# Patient Record
Sex: Male | Born: 1935 | Race: White | Hispanic: No | Marital: Married | Smoking: Never smoker
Health system: Southern US, Community
[De-identification: ages and names within clinical notes are randomized; demographics above are authoritative.]

## PROBLEM LIST (undated history)

## (undated) DIAGNOSIS — I10 Essential (primary) hypertension: Secondary | ICD-10-CM

## (undated) DIAGNOSIS — I4891 Unspecified atrial fibrillation: Secondary | ICD-10-CM

## (undated) DIAGNOSIS — R569 Unspecified convulsions: Secondary | ICD-10-CM

---

## 2021-08-21 ENCOUNTER — Encounter (HOSPITAL_COMMUNITY): Payer: Self-pay

## 2021-08-21 ENCOUNTER — Emergency Department (HOSPITAL_COMMUNITY): Payer: Medicare Other

## 2021-08-21 ENCOUNTER — Emergency Department (HOSPITAL_COMMUNITY)
Admission: EM | Admit: 2021-08-21 | Discharge: 2021-08-21 | Disposition: A | Discharge status: Home / Self Care | Payer: Medicare Other | Attending: Emergency Medicine | Admitting: Emergency Medicine

## 2021-08-21 ENCOUNTER — Other Ambulatory Visit: Payer: Self-pay

## 2021-08-21 DIAGNOSIS — R0781 Pleurodynia: Secondary | ICD-10-CM | POA: Diagnosis present

## 2021-08-21 DIAGNOSIS — Z7901 Long term (current) use of anticoagulants: Secondary | ICD-10-CM | POA: Diagnosis not present

## 2021-08-21 DIAGNOSIS — W19XXXA Unspecified fall, initial encounter: Secondary | ICD-10-CM | POA: Diagnosis not present

## 2021-08-21 DIAGNOSIS — R42 Dizziness and giddiness: Secondary | ICD-10-CM | POA: Insufficient documentation

## 2021-08-21 HISTORY — DX: Unspecified convulsions: R56.9

## 2021-08-21 HISTORY — DX: Essential (primary) hypertension: I10

## 2021-08-21 HISTORY — DX: Unspecified atrial fibrillation: I48.91

## 2021-08-21 LAB — CBC WITH DIFFERENTIAL/PLATELET
Abs Immature Granulocytes: 0.04 10*3/uL (ref 0.00–0.07)
Basophils Absolute: 0.1 10*3/uL (ref 0.0–0.1)
Basophils Relative: 1 %
Eosinophils Absolute: 0.2 10*3/uL (ref 0.0–0.5)
Eosinophils Relative: 2 %
HCT: 41.8 % (ref 39.0–52.0)
Hemoglobin: 13.7 g/dL (ref 13.0–17.0)
Immature Granulocytes: 0 %
Lymphocytes Relative: 8 %
Lymphs Abs: 0.9 10*3/uL (ref 0.7–4.0)
MCH: 30.6 pg (ref 26.0–34.0)
MCHC: 32.8 g/dL (ref 30.0–36.0)
MCV: 93.3 fL (ref 80.0–100.0)
Monocytes Absolute: 0.9 10*3/uL (ref 0.1–1.0)
Monocytes Relative: 9 %
Neutro Abs: 8.1 10*3/uL — ABNORMAL HIGH (ref 1.7–7.7)
Neutrophils Relative %: 80 %
Platelets: 210 10*3/uL (ref 150–400)
RBC: 4.48 MIL/uL (ref 4.22–5.81)
RDW: 12.7 % (ref 11.5–15.5)
WBC: 10.2 10*3/uL (ref 4.0–10.5)
nRBC: 0 % (ref 0.0–0.2)

## 2021-08-21 LAB — COMPREHENSIVE METABOLIC PANEL
ALT: 23 U/L (ref 0–44)
AST: 23 U/L (ref 15–41)
Albumin: 4.6 g/dL (ref 3.5–5.0)
Alkaline Phosphatase: 89 U/L (ref 38–126)
Anion gap: 7 (ref 5–15)
BUN: 14 mg/dL (ref 8–23)
CO2: 28 mmol/L (ref 22–32)
Calcium: 9.5 mg/dL (ref 8.9–10.3)
Chloride: 107 mmol/L (ref 98–111)
Creatinine, Ser: 0.93 mg/dL (ref 0.61–1.24)
GFR, Estimated: >60 mL/min (ref >60)
Glucose, Bld: 115 mg/dL — ABNORMAL HIGH (ref 70–99)
Potassium: 4.7 mmol/L (ref 3.5–5.1)
Sodium: 142 mmol/L (ref 135–145)
Total Bilirubin: 0.5 mg/dL (ref 0.3–1.2)
Total Protein: 7.7 g/dL (ref 6.5–8.1)

## 2021-08-21 LAB — PHENYTOIN LEVEL, TOTAL: Phenytoin Lvl: 8.7 ug/mL — ABNORMAL LOW (ref 10.0–20.0)

## 2021-08-21 MED ORDER — IBUPROFEN 200 MG PO TABS
400.0000 mg | ORAL_TABLET | Freq: Once | ORAL | Status: AC
  Administered 2021-08-21: 400 mg via ORAL
  Filled 2021-08-21: qty 2

## 2021-08-21 NOTE — ED Triage Notes (Addendum)
Patient states his right leg gave out last night and caused him to fall on carpet. Patient does not know if he hit his head. Patient is on Eliquis. Patient c/o right rib cage pain.  BP- 147/91.

## 2021-08-21 NOTE — ED Provider Notes (Signed)
[facility]EM[facility]GENCY DEPT Provider Note   CSN: [id number] Arrival date & time: 08/21/21  1013     History  Chief Complaint  Patient presents with   Charlotta Newton Donyea Beverlin is a 88 y.o. male.  88 year old male who presents after having an a fall yesterday.  Patient states that has not been sleeping very well.  Got up felt dizzy and fell down onto carpet.  Is unsure if he struck his head or passed out.  Complains of pain to his right rib cage.  Denies any confusion or emesis.  Patient is on Eliquis.  He has not been short of breath.  Has had trouble ambulating       Home Medications Prior to Admission medications   Not on File      Allergies    Patient has no known allergies.    Review of Systems   Review of Systems  All other systems reviewed and are negative.   Physical Exam Updated Vital Signs BP (!) 147/91 (BP Location: Right Arm)   Pulse 86   Temp 98.3 F (36.8 C) (Oral)   Resp 16   Ht 1.829 m (6')   Wt 93.4 kg   SpO2 97%   BMI 27.94 kg/m  Physical Exam Vitals and nursing note reviewed.  Constitutional:      General: He is not in acute distress.    Appearance: Normal appearance. He is well-developed. He is not toxic-appearing.  HENT:     Head: Normocephalic and atraumatic.  Eyes:     General: Lids are normal.     Conjunctiva/sclera: Conjunctivae normal.     Pupils: Pupils are equal, round, and reactive to light.  Neck:     Thyroid: No thyroid mass.     Trachea: No tracheal deviation.  Cardiovascular:     Rate and Rhythm: Normal rate and regular rhythm.     Heart sounds: Normal heart sounds. No murmur heard.    No gallop.  Pulmonary:     Effort: Pulmonary effort is normal. No respiratory distress.     Breath sounds: Normal breath sounds. No stridor. No decreased breath sounds, wheezing, rhonchi or rales.  Abdominal:     General: There is no distension.     Palpations: Abdomen is soft.     Tenderness: There is no  abdominal tenderness. There is no rebound.  Musculoskeletal:        General: No tenderness. Normal range of motion.     Cervical back: Normal range of motion and neck supple.  Skin:    General: Skin is warm and dry.     Findings: No abrasion or rash.  Neurological:     General: No focal deficit present.     Mental Status: He is alert and oriented to person, place, and time. Mental status is at baseline.     GCS: GCS eye subscore is 4. GCS verbal subscore is 5. GCS motor subscore is 6.     Cranial Nerves: No cranial nerve deficit.     Sensory: No sensory deficit.     Motor: Motor function is intact.     Gait: Gait is intact.  Psychiatric:        Attention and Perception: Attention normal.        Speech: Speech normal.        Behavior: Behavior normal.     ED Results / Procedures / Treatments   Labs (all labs ordered are listed, but only  abnormal results are displayed) Labs Reviewed  CBC WITH DIFFERENTIAL/PLATELET  COMPREHENSIVE METABOLIC PANEL  PHENYTOIN LEVEL, TOTAL    EKG EKG Interpretation  Date/Time:  Friday August 21 2021 13:27:12 EDT Ventricular Rate:  79 PR Interval:  52 QRS Duration: 87 QT Interval:  382 QTC Calculation: 438 R Axis:   -20 Text Interpretation: Sinus or ectopic atrial rhythm Atrial premature complex Short PR interval Borderline left axis deviation Confirmed by Lorre Nick ([id number]) on 08/21/2021 1:29:10 PM  Radiology No results found.  Procedures Procedures    Medications Ordered in ED Medications - No data to display  ED Course/ Medical Decision Making/ A&P                           Medical Decision Making Amount and/or Complexity of Data Reviewed Labs: ordered. Radiology: ordered.  Risk OTC drugs.   Patient's EKG per my interpretation shows sinus rhythm without evidence of acute ischemic changes.  Patient's neurological exam is stable here.  Head CT per my interpretation shows no acute findings.  Patient had an x-ray of his right  ribs and chest which per interpretation showed no acute abnormality.  Concern for possible Dilantin toxicity as patient stated that he felt slightly ataxic.  His Dilantin level was subtherapeutic at 8.7.  Patient states this is where he normally is.  Lecture lites within normal limits.  Patient endorses that he has not taken his anxiety medications.  I do not feel that patient has any acute neurological issue.  His family who is at bedside concurs with this.  Will discharge home and follow-up with his doctor as needed        Final Clinical Impression(s) / ED Diagnoses Final diagnoses:  None    Rx / DC Orders ED Discharge Orders     None         Lorre Nick, MD 08/21/21 1330

## 2022-06-07 DEATH — deceased

## 2022-09-25 IMAGING — CT CT HEAD W/O CM
3 series · 16 of 47 positions shown, 19 images · non-contrast
Comparison: None Available.

CLINICAL DATA: Head trauma, intracranial arterial injury suspected.
Fall.



[Series 2: head wo · axial · 0.50mm/px · z∈[-146,+9]mm · 10 of 37 slices shown, 13 images]
[im 3/37  brain]
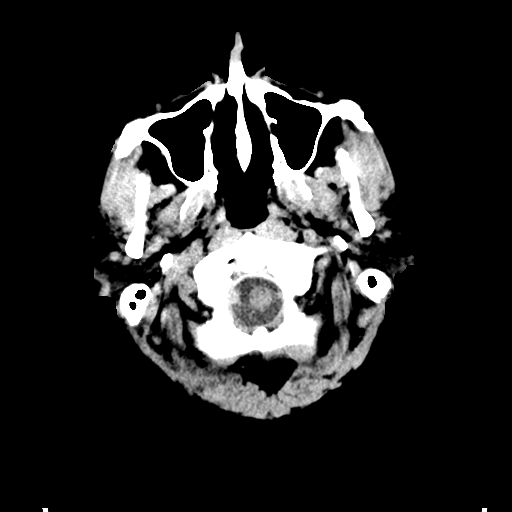
[im 3/37  bone]
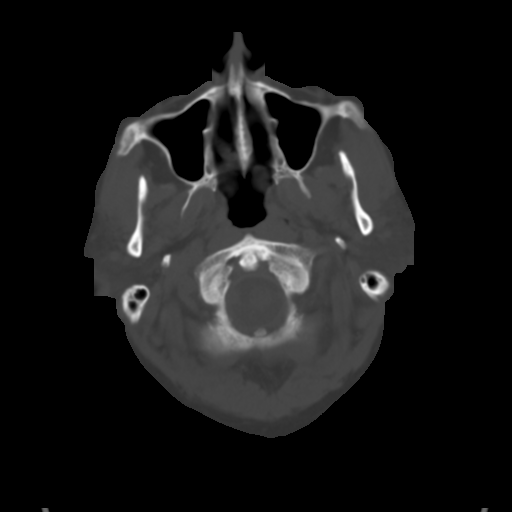
[im 7/37  brain]
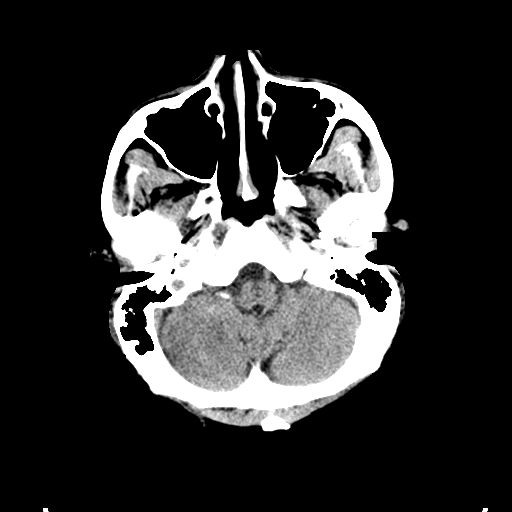
[im 10/37  brain]
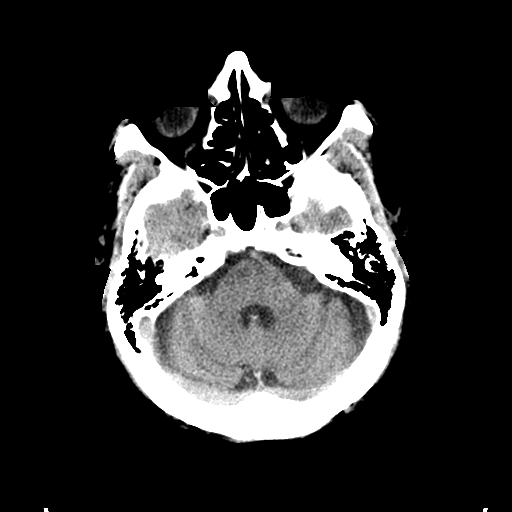
[im 13/37  brain]
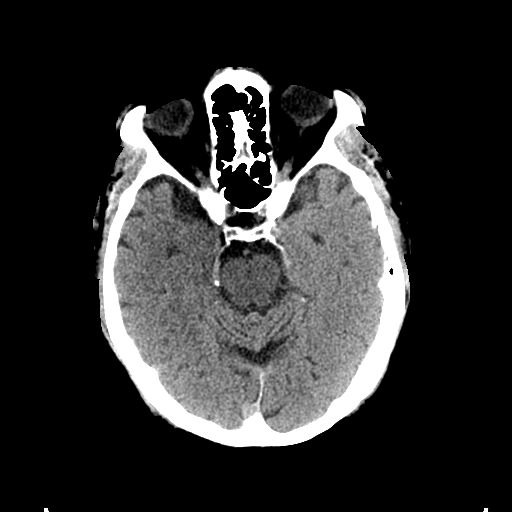
[im 17/37  brain]
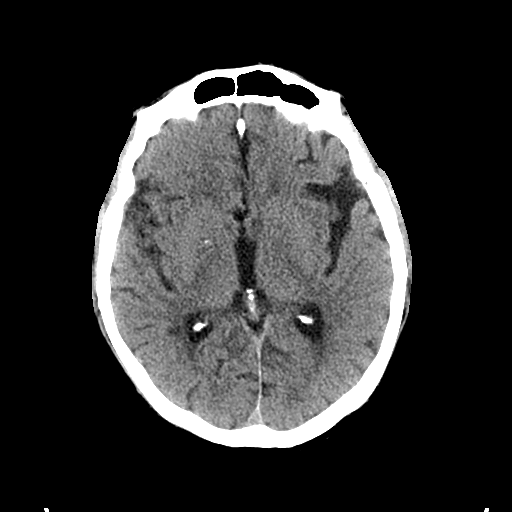
[im 17/37  bone]
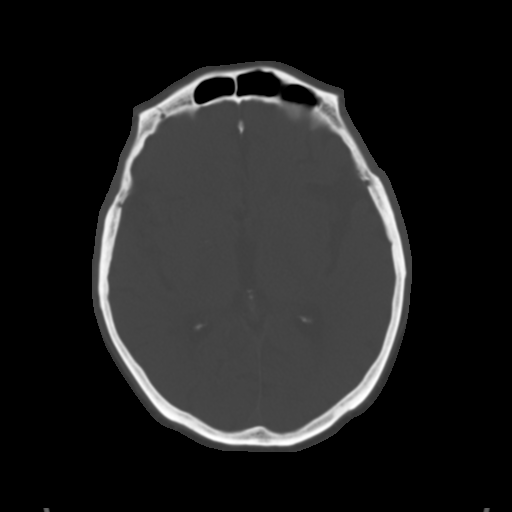
[im 20/37  brain]
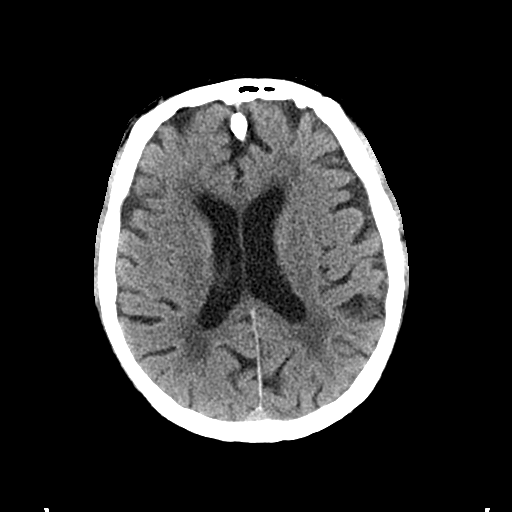
[im 24/37  brain]
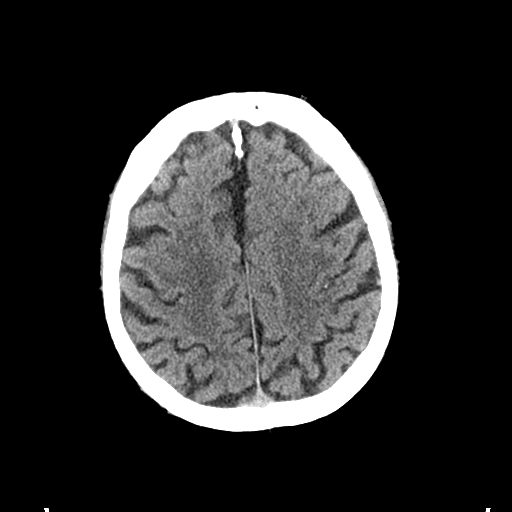
[im 28/37  brain]
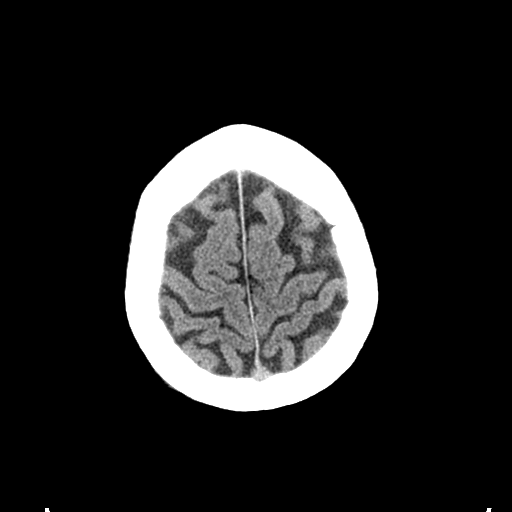
[im 30/37  brain]
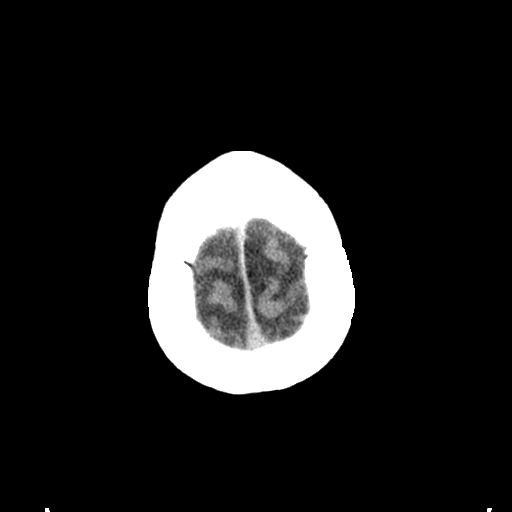
[im 30/37  bone]
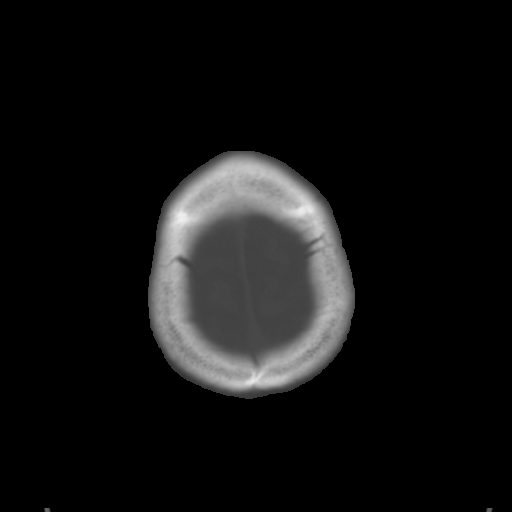
[im 34/37  brain]
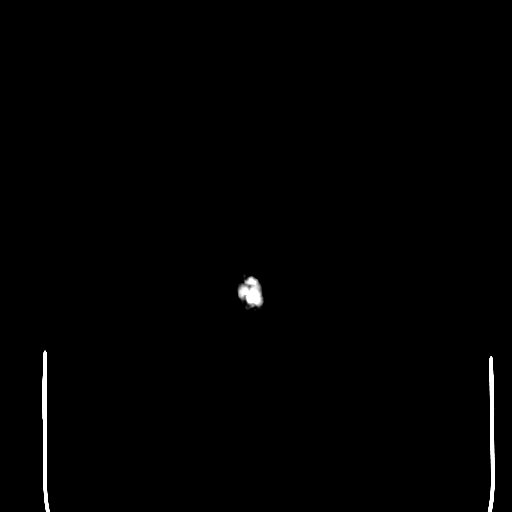

[Series 4: coronal soft tissue · coronal · 0.41mm/px · 3 of 74 slices shown]
[im 25/74  brain]
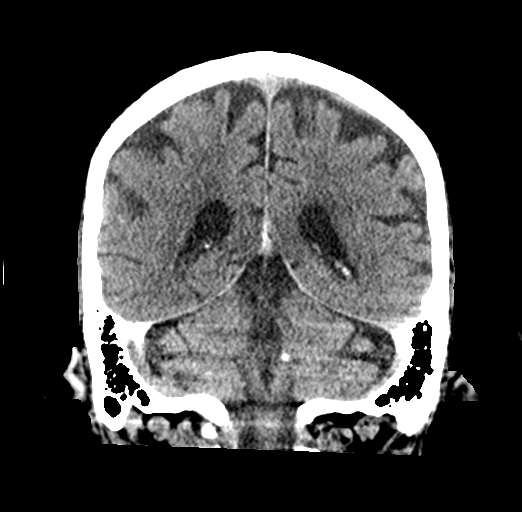
[im 33/74  brain]
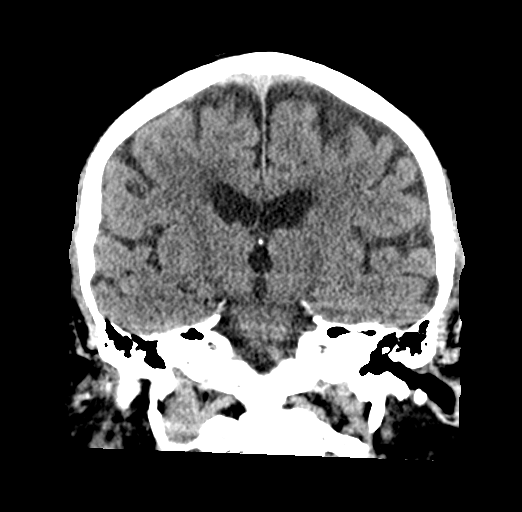
[im 41/74  brain]
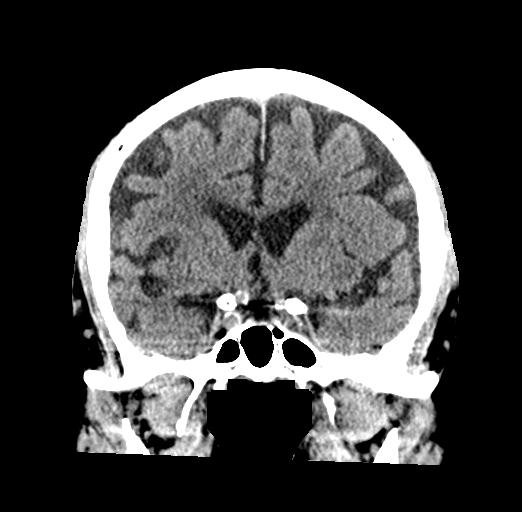

[Series 5: sagittal soft tissue · sagittal · 0.40mm/px · 3 of 67 slices shown]
[im 23/67  brain]
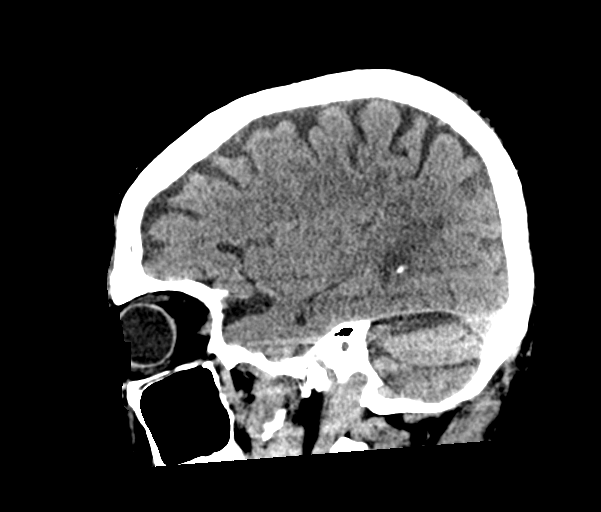
[im 34/67  brain]
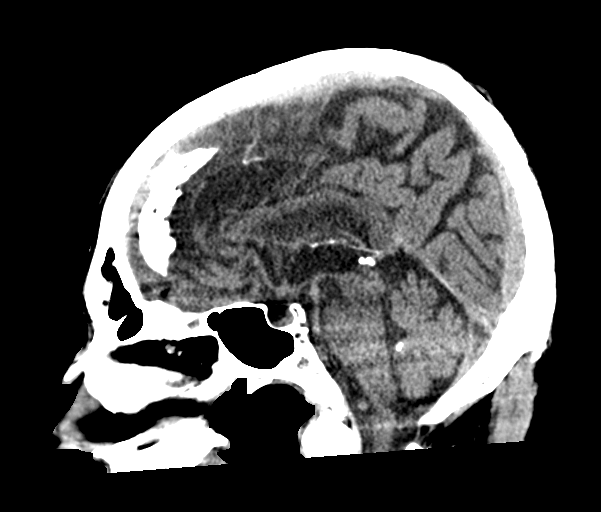
[im 45/67  brain]
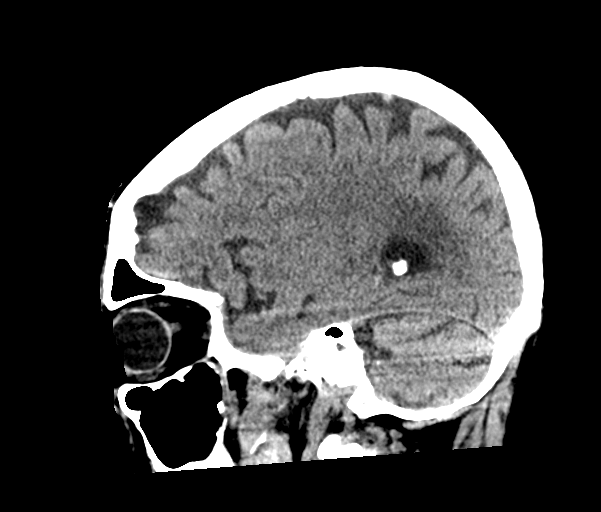

[16 of 47 positions shown; findings below may reference images not displayed]

FINDINGS: Brain: There is no evidence of an acute infarct, intracranial
hemorrhage, mass, midline shift, or extra-axial fluid collection.
Mild generalized cerebral atrophy is within normal limits for age.
Hypodensities in the cerebral white matter bilaterally are
nonspecific but compatible with mild-to-moderate chronic small
vessel ischemic disease.

Vascular: No hyperdense vessel.

Skull: No acute fracture or suspicious osseous lesion.

Sinuses/Orbits: Visualized paranasal sinuses and mastoid air cells
are clear. Bilateral cataract extraction.

Other: None.
IMPRESSION: 1. No evidence of acute intracranial abnormality.
2. Mild-to-moderate chronic small vessel ischemic disease.

## 2022-09-25 IMAGING — CR DG RIBS W/ CHEST 3+V*R*
5 series · 5 of 5 positions shown · non-contrast
Comparison: None Available.

CLINICAL DATA: fall, right anterior rib pain

EXAM:
RIGHT RIBS AND CHEST - 4 VIEW

[w chest pa]
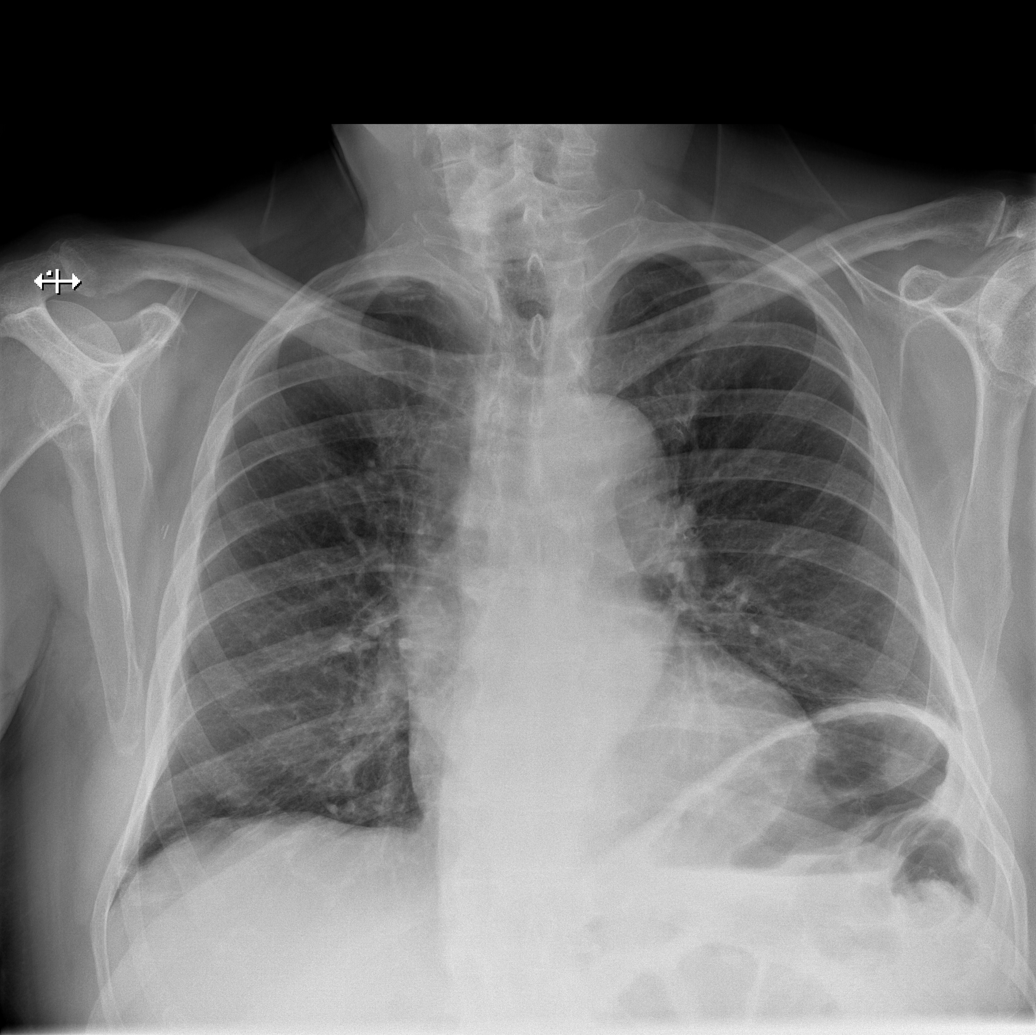

[w ribs ap upper right]
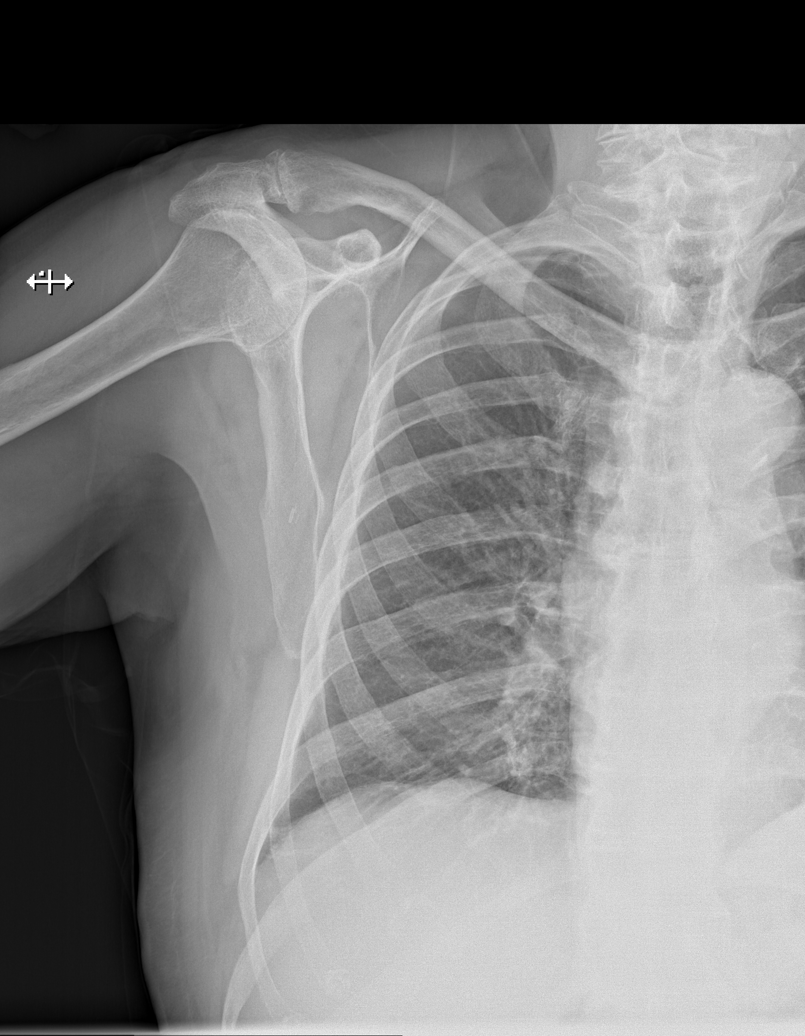

[w ribs ap lower right]
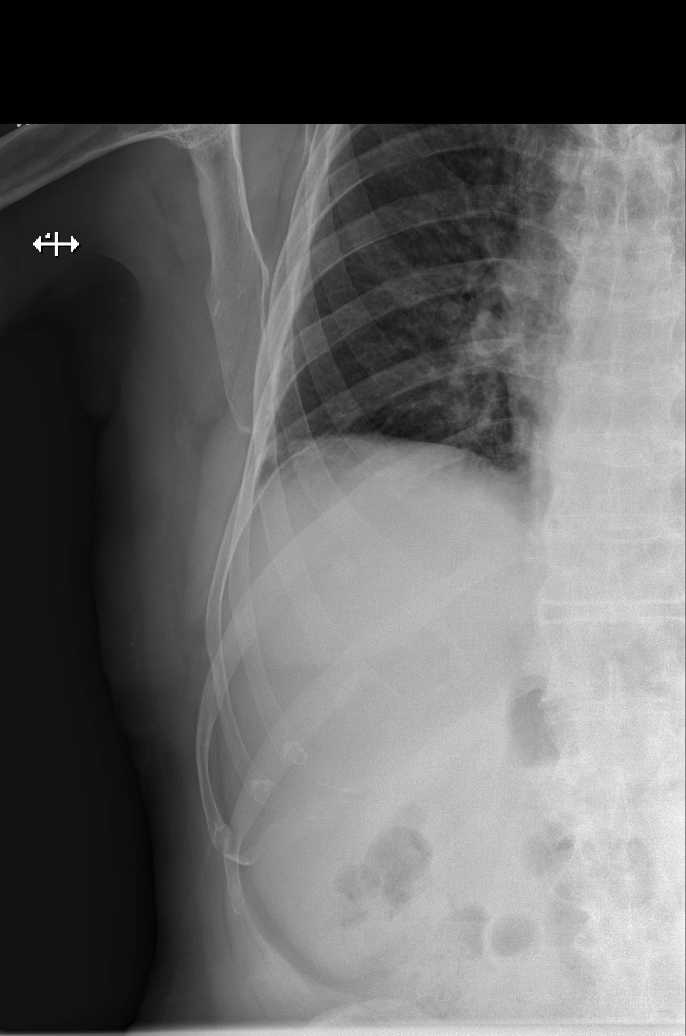

[w ribs obl right (1 of 2)]
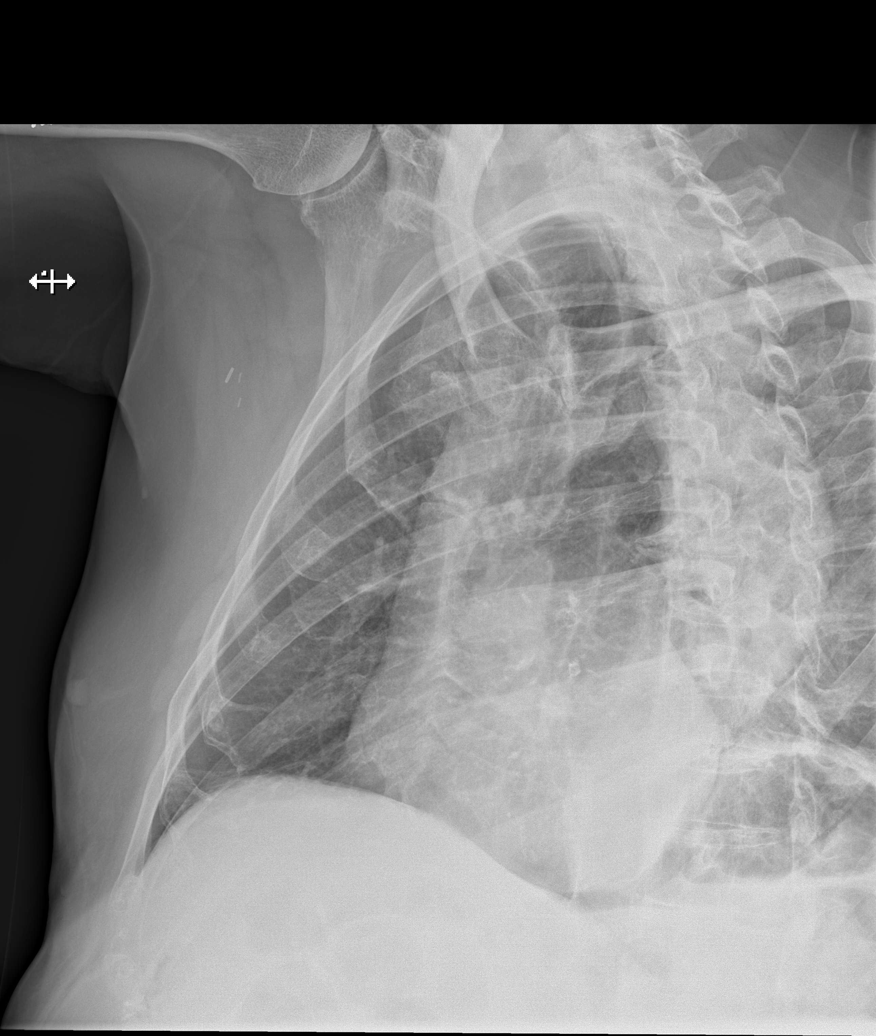

[w ribs obl right (2 of 2)]
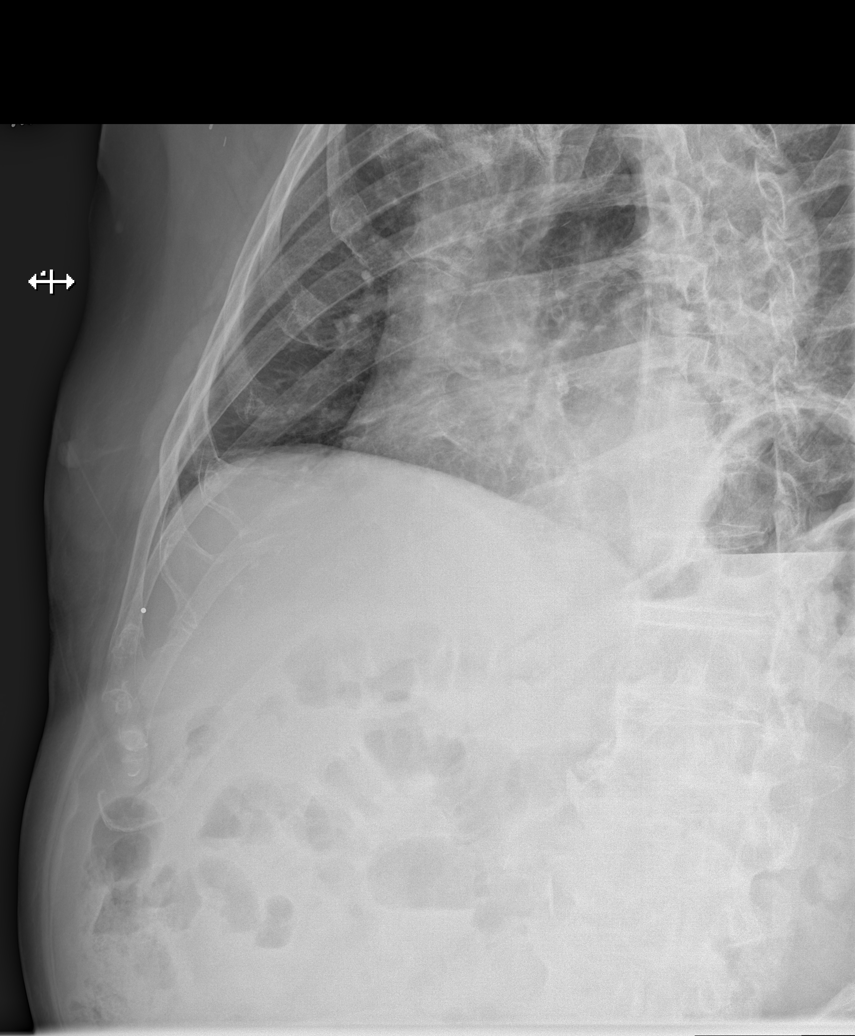

[5 of 5 positions shown; findings below may reference images not displayed]

FINDINGS: No fracture or other bone lesions are seen involving the ribs. There
is no evidence of pneumothorax or pleural effusion. Both lungs are
clear. Heart size and mediastinal contours are within normal limits.
Thoracic aorta is ectatic. Elevation of the left hemidiaphragm.
IMPRESSION: Negative.
